# Patient Record
Sex: Female | Born: 2005 | Race: White | Hispanic: No | Marital: Single | Smoking: Never smoker
Health system: Southern US, Community
[De-identification: ages and names within clinical notes are randomized; demographics above are authoritative.]

## PROBLEM LIST (undated history)

## (undated) DIAGNOSIS — T7840XA Allergy, unspecified, initial encounter: Secondary | ICD-10-CM

---
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
## 2024-02-27 ENCOUNTER — Other Ambulatory Visit: Payer: Self-pay | Admitting: Physician Assistant

## 2024-03-04 ENCOUNTER — Encounter

## 2024-03-04 ENCOUNTER — Telehealth: Admitting: Family Medicine

## 2024-03-04 ENCOUNTER — Encounter: Payer: Self-pay | Admitting: Family Medicine

## 2024-03-04 VITALS — Temp 101.0°F

## 2024-03-04 DIAGNOSIS — R051 Acute cough: Secondary | ICD-10-CM

## 2024-03-04 DIAGNOSIS — R509 Fever, unspecified: Secondary | ICD-10-CM | POA: Diagnosis not present

## 2024-03-04 NOTE — Progress Notes (Signed)
" °  Phone [phone] Virtual visit via Video note   Subjective:  Chief complaint: Chief Complaint  Patient presents with   Cough    Unable to connect with patient via phone for work up;    Generalized Body Aches   Fever    Our team/I connected with Bernice Charlies Lyme at 10:20 AM EST by a video enabled telemedicine application (caregility through epic) and verified that I am speaking with the correct person using two identifiers. Our team/I discussed the limitations of evaluation and management by telemedicine and the availability of in person appointments.No physical exam was performed (except for noted visual exam or audio findings with Telehealth visits).   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     fluticasone (FLONASE) 50 MCG/ACT nasal spray USE 1-2 SPRAYS IN EACH NOSTRIL NASALLY ONCE A DAY     Multiple Vitamin (MULTIVITAMIN) TABS Take by mouth.     norgestimate -ethinyl estradiol  (ORTHO-CYCLEN) 0.25-35 MG-MCG tablet TAKE 1 TABLET BY MOUTH EVERY DAY 84 tablet 0   No current facility-administered medications for this visit.     Objective:  Temp (!) 101 F (38.3 C)  self reported vitals Gen: NAD, resting comfortably Lungs: nonlabored, normal respiratory rate  Skin: appears dry, no obvious rash     Assessment and Plan     # flu like symptoms (potential COVID, influenza or upper respiratory infection (URI)) S: presents with including  subjective fever. Minimal body aches. Cough started last night. Mild dizziness this morning and headache. Dizziness now better. A lot of sinus congestion. Fever 101 -started: last night -inside 48 hour treatment window if needed for tamiflu: yes -high risk condition (children <5, adults >65, chronic pulmonary or cardiac condition, immunosuppression, pregnancy, nursing home resident, morbid obesity) : no -symptoms are worsening -previous treatments: acetaminophen , dayquil - patient did get receive flu shot this year at a [facility]  - positive sick contact; specifically influenza: uncertain but did have sick contact at a bar mitzvah   A/P:  Flu-like illness: possible Covid vs flu vs URI History and exam today are suggestive of viral process. Patients influenza test was not done.  Pretest probability of influenza was moderate. High risk condition: no - she's going to do a home test for COVID and flu and update us  by mychart.  -she's requesting tamiflu or Paxlovid if positive even without high risk condition- I'm willing tow rite this after going over benefits (mild) and risk such as side effects profile  Finally, we reviewed reasons to return to care including if symptoms worsen or persist or new concerns arise.    Recommended follow up: awaiting COVID/flu test  Lab/Order associations:   ICD-10-CM   1. Acute cough  R05.1     2. Fever, unspecified fever cause  R50.9       Return precautions advised.  Garnette Lukes, MD  "
